# Patient Record
Sex: Male | Born: 1995 | Race: White | Hispanic: No | Marital: Single | Smoking: Current every day smoker
Health system: Southern US, Community
[De-identification: ages and names within clinical notes are randomized; demographics above are authoritative.]

---

## 2017-12-11 ENCOUNTER — Emergency Department (HOSPITAL_COMMUNITY)
Admission: EM | Admit: 2017-12-11 | Discharge: 2017-12-11 | Disposition: A | Discharge status: Home / Self Care | Payer: Self-pay | Attending: Emergency Medicine | Admitting: Emergency Medicine

## 2017-12-11 ENCOUNTER — Encounter (HOSPITAL_COMMUNITY): Payer: Self-pay | Admitting: Emergency Medicine

## 2017-12-11 ENCOUNTER — Emergency Department (HOSPITAL_COMMUNITY): Payer: Self-pay

## 2017-12-11 DIAGNOSIS — F17210 Nicotine dependence, cigarettes, uncomplicated: Secondary | ICD-10-CM | POA: Insufficient documentation

## 2017-12-11 DIAGNOSIS — F1520 Other stimulant dependence, uncomplicated: Secondary | ICD-10-CM | POA: Insufficient documentation

## 2017-12-11 DIAGNOSIS — M79671 Pain in right foot: Secondary | ICD-10-CM | POA: Insufficient documentation

## 2017-12-11 DIAGNOSIS — Z789 Other specified health status: Secondary | ICD-10-CM

## 2017-12-11 MED ORDER — IBUPROFEN 800 MG PO TABS
800.0000 mg | ORAL_TABLET | Freq: Once | ORAL | Status: AC
  Administered 2017-12-11: 800 mg via ORAL
  Filled 2017-12-11: qty 1

## 2017-12-11 NOTE — ED Notes (Signed)
ED Provider at bedside. 

## 2017-12-11 NOTE — ED Notes (Signed)
Bed: [facility] Expected date:  Expected time:  Means of arrival:  Comments: 

## 2017-12-11 NOTE — ED Triage Notes (Signed)
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 Reports last use of yesterday and has been using for about a year. Pt c/o right foot pain since yesterday due to walking 6-7 [place] on it. Denies hallucinations. Reports mild headache. Denies SI/HI thoughts.

## 2017-12-11 NOTE — ED Notes (Signed)
Patient transported to X-ray 

## 2017-12-11 NOTE — Discharge Instructions (Addendum)
Please read and follow all provided instructions.  You have been seen today for request for resources and right foot pain.  I have included a resource guide in your discharge instructions.  Please be sure to call today in order to schedule an appointment with one of these facilities.   Tests performed today include: An x-ray of the affected area - does NOT show any broken bones or dislocations.  Vital signs. See below for your results today.   Home care instructions: -- *PRICE in the first 24-48 hours after injury: Protect (with brace, splint, sling), if given by your provider Rest Ice- Do not apply ice pack directly to your skin, place towel or similar between your skin and ice/ice pack. Apply ice for 20 min, then remove for 40 min while awake Compression  Elevate affected extremity above the level of your heart when not walking around for the first 24-48 hours   Use Ibuprofen (Motrin/Advil) 600mg  every 6 hours as needed for pain (do not exceed max dose in 24 hours, 2400mg )  Follow-up instructions: Please follow-up with your primary care provider or the provided orthopedic physician (bone specialist) if you continue to have significant pain in 1 week. In this case you may have a more severe injury that requires further care.   Return instructions:  Please return if your toes or feet are numb or tingling, appear gray or blue, or you have severe pain (also elevate the leg and loosen splint or wrap if you were given one) Please return to the Emergency Department if you experience worsening symptoms.  Please return if you have any other emergent concerns. Additional Information:  Your vital signs today were: BP (!) 142/94 (BP Location: Left Arm)    Pulse 78    Temp 98.9 F (37.2 C) (Oral)    Resp 18    Ht 5\' 6"  (1.676 m)    SpO2 100%  If your blood pressure (BP) was elevated above 135/85 this visit, please have this repeated by your doctor within one month. ---------------

## 2017-12-11 NOTE — ED Provider Notes (Signed)
[facility]-EMERGENCY DEPT Provider Note   CSN: [id number] Arrival date & time: 12/11/17  1205     History   Chief Complaint Chief Complaint  Patient presents with  . Detox    HPI Peter Pierce is a 21 y.o. male who presents the emergency department with 2 separate complaints today.  Patient primarily is here to get resources due to substance abuse.  Patient states that he uses methamphetamine, most recently used yesterday.  States his use is intermittent, not necessarily daily, difficulty quantifying amount use.  Only other substance use is tobacco, no alcohol or opioids.  States he does not feel as if he is withdrawing right now.  Denies SI/HI or hallucinations. Upon further inquiry, patient reports right foot pain after having walked approximately 6-7 [place] yesterday.  Describes pain as a constant aching pain, worse with ambulation, no alleviating factors.  Denies numbness or weakness.  Denies chest pain, shortness of breath, abdominal pain, or N/V/D. Queried in regards to headache in triage notes, patient denies headache at this time.  HPI  History reviewed. No pertinent past medical history.  There are no active problems to display for this patient.   History reviewed. No pertinent surgical history.     Home Medications    Prior to Admission medications   Not on File    Family History History reviewed. No pertinent family history.  Social History Social History   Tobacco Use  . Smoking status: Current Every Day Smoker  Substance Use Topics  . Alcohol use: No    Frequency: Never  . Drug use: Yes    Types: Methamphetamines     Allergies   Patient has no known allergies.   Review of Systems Review of Systems  Constitutional: Negative for chills and fever.  Eyes: Negative for visual disturbance.  Respiratory: Negative for shortness of breath.   Cardiovascular: Negative for chest pain.  Gastrointestinal: Negative for abdominal pain,  diarrhea, nausea and vomiting.  Musculoskeletal: Positive for myalgias (R foot pain).  Neurological: Negative for weakness, numbness and headaches.  All other systems reviewed and are negative.    Physical Exam Updated Vital Signs BP (!) 142/94 (BP Location: Left Arm)   Pulse 78   Temp 98.9 F (37.2 C) (Oral)   Resp 18   Ht 5\' 6"  (1.676 m)   SpO2 100%   Physical Exam  Constitutional: He appears well-developed and well-nourished. No distress.  HENT:  Head: Normocephalic and atraumatic.  Eyes: Conjunctivae are normal. Pupils are equal, round, and reactive to light. Right eye exhibits no discharge. Left eye exhibits no discharge.  Neck: Neck supple.  Cardiovascular: Normal rate and regular rhythm.  No murmur heard. Pulses:      Dorsalis pedis pulses are 2+ on the right side, and 2+ on the left side.  Pulmonary/Chest: Effort normal and breath sounds normal. No respiratory distress. He has no wheezes.  Abdominal: Soft. He exhibits no distension. There is no tenderness.  Musculoskeletal:  Lower extremities: No obvious deformity, warmth, or erythema.  Right foot- small area of ecchymosis laterally ventrally.  Patient has full range of motion of the ankle.  Tenderness to palpation ventrally over the midfoot.  No other bony tenderness to palpation, no malleolar tenderness, no tenderness at the base of the fifth, and no navicular tenderness.  Neurological:  Alert. Clear speech. No facial droop. CNIII-XII are intact. Bilateral lower extremities' sensation intact to sharp and dull touch. 5/5 plantar and dorsi flexion bilaterally. Normal gait.  Psychiatric: He has a normal mood and affect. His behavior is normal. Thought content normal.  Nursing note and vitals reviewed.    ED Treatments / Results  Labs (all labs ordered are listed, but only abnormal results are displayed) Labs Reviewed - No data to display  EKG  EKG Interpretation None       Radiology Dg Foot Complete  Right  Result Date: 12/11/2017 CLINICAL DATA:  Dorsal foot pain after walking 10 [place] yesterday. EXAM: RIGHT FOOT COMPLETE - 3+ VIEW COMPARISON:  None. FINDINGS: There is no evidence of fracture or dislocation. There is no evidence of arthropathy or other focal bone abnormality. Soft tissues are unremarkable. IMPRESSION: Negative. Electronically Signed   By: Francene BoyersJames  Maxwell M.D.   On: 12/11/2017 13:09    Procedures Procedures (including critical care time)  Medications Ordered in ED Medications  ibuprofen (ADVIL,MOTRIN) tablet 800 mg (not administered)     Initial Impression / Assessment and Plan / ED Course  I have reviewed the triage vital signs and the nursing notes.  Pertinent labs & imaging results that were available during my care of the patient were reviewed by me and considered in my medical decision making (see chart for details).  Patient presents requesting resources to detox from methamphetamine.  Patient without comorbidities to effect detox. Patient denies further drug or alcohol usage. Also denies SI/HI, audio and visual hallucinations. Patient is alert & oriented, nontoxic-appearing, in no apparent distress, with stable vital signs.  Upon further inquiry patient reports right foot pain following walking 6-7 [place] yesterday.  Patient is neurovascularly intact distally.  X-ray is negative for any acute fracture or dislocation.  No recent surgery or open wounds, patient is afebrile, x-ray negative, doubt osteomyelitis.  Ibuprofen given in the emergency department for pain. Patient advised to follow up with orthopedics if symptoms persist for possibility of missed fracture diagnosis. Recommended conservative therapy for foot pain at this time.    Patient is to be discharged home with behavioral health resources for finding a detoxification program. It has been determined that no acute conditions requiring further emergency intervention are present at this time. I discussed results,  treatment plan, need for detox program and orthopedics follow-up, and return precautions with the patient. Provided opportunity for questions, patient confirmed understanding and is in agreement with plan.     Final Clinical Impressions(s) / ED Diagnoses   Final diagnoses:  Right foot pain  Need for community resource    ED Discharge Orders    None       Cherly Andersonetrucelli, Samantha R, [place]PA-C 12/11/17 1340    Doug SouJacubowitz, Sam, MD 12/11/17 [id number]

## 2018-12-08 IMAGING — CR DG FOOT COMPLETE 3+V*R*
3 series · 3 of 3 positions shown · non-contrast
Comparison: None.

CLINICAL DATA: Dorsal foot pain after walking 10 miles yesterday.

EXAM:
RIGHT FOOT COMPLETE - 3+ VIEW

[x foot ap right]
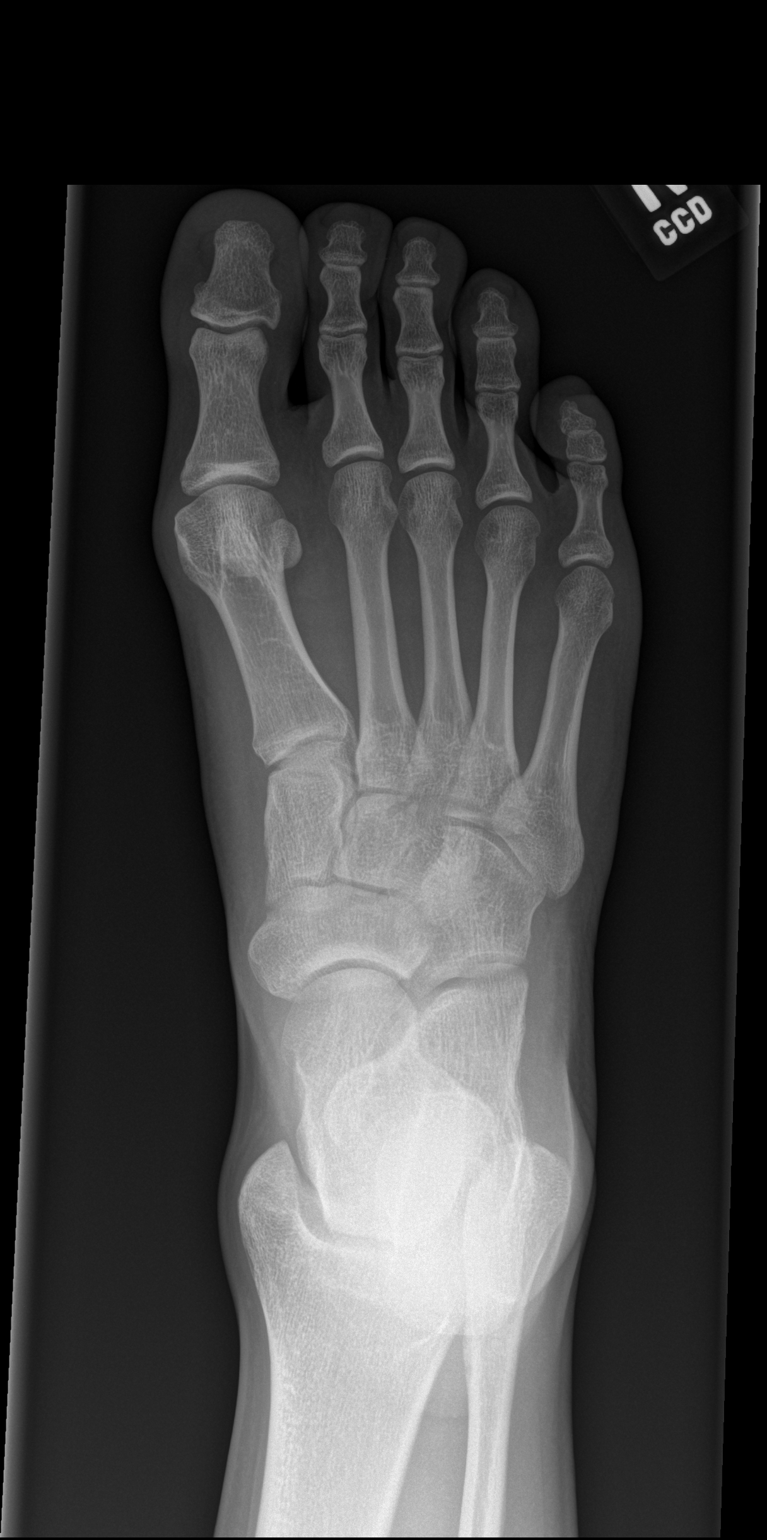

[x foot obl right]
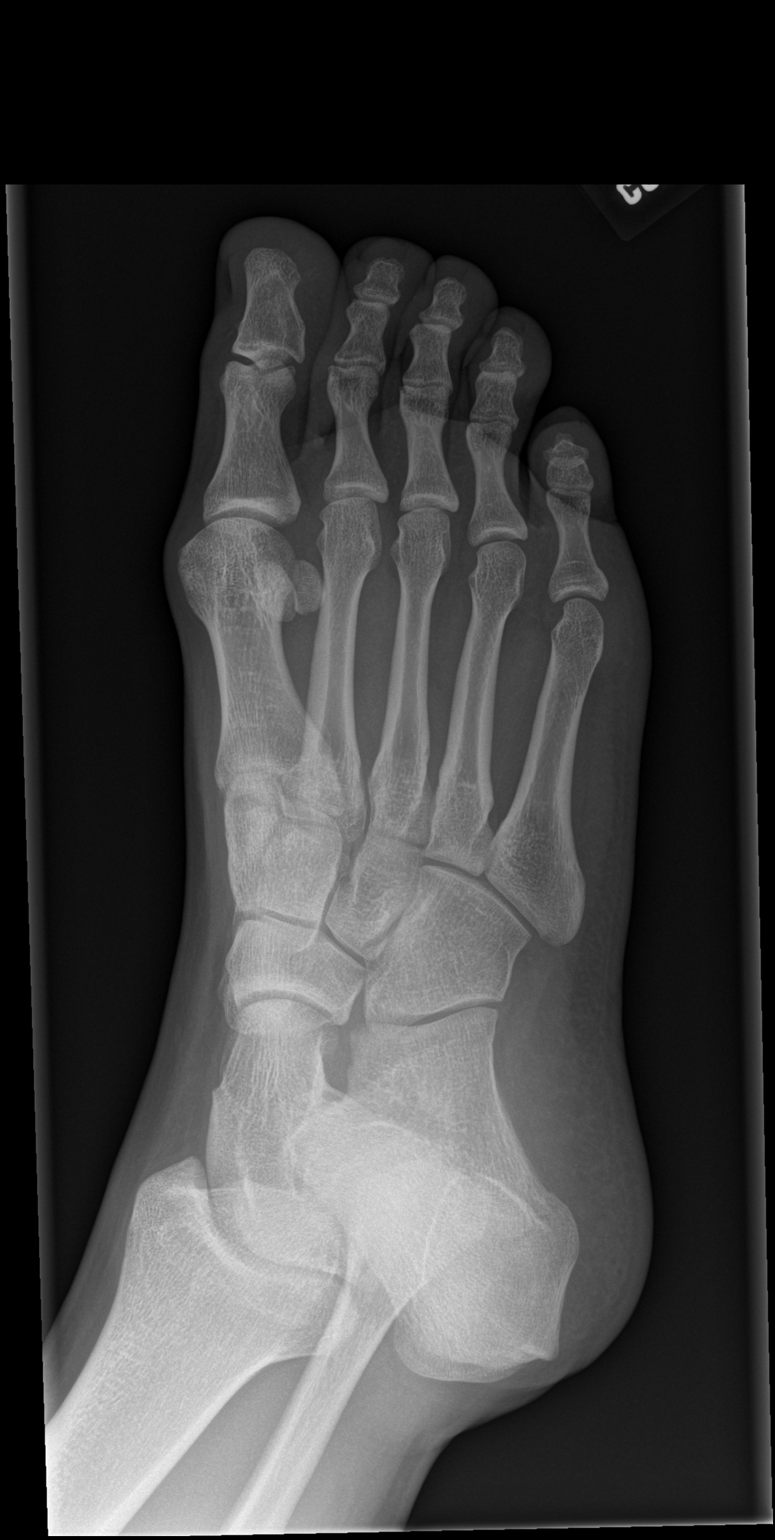

[x foot lat right]
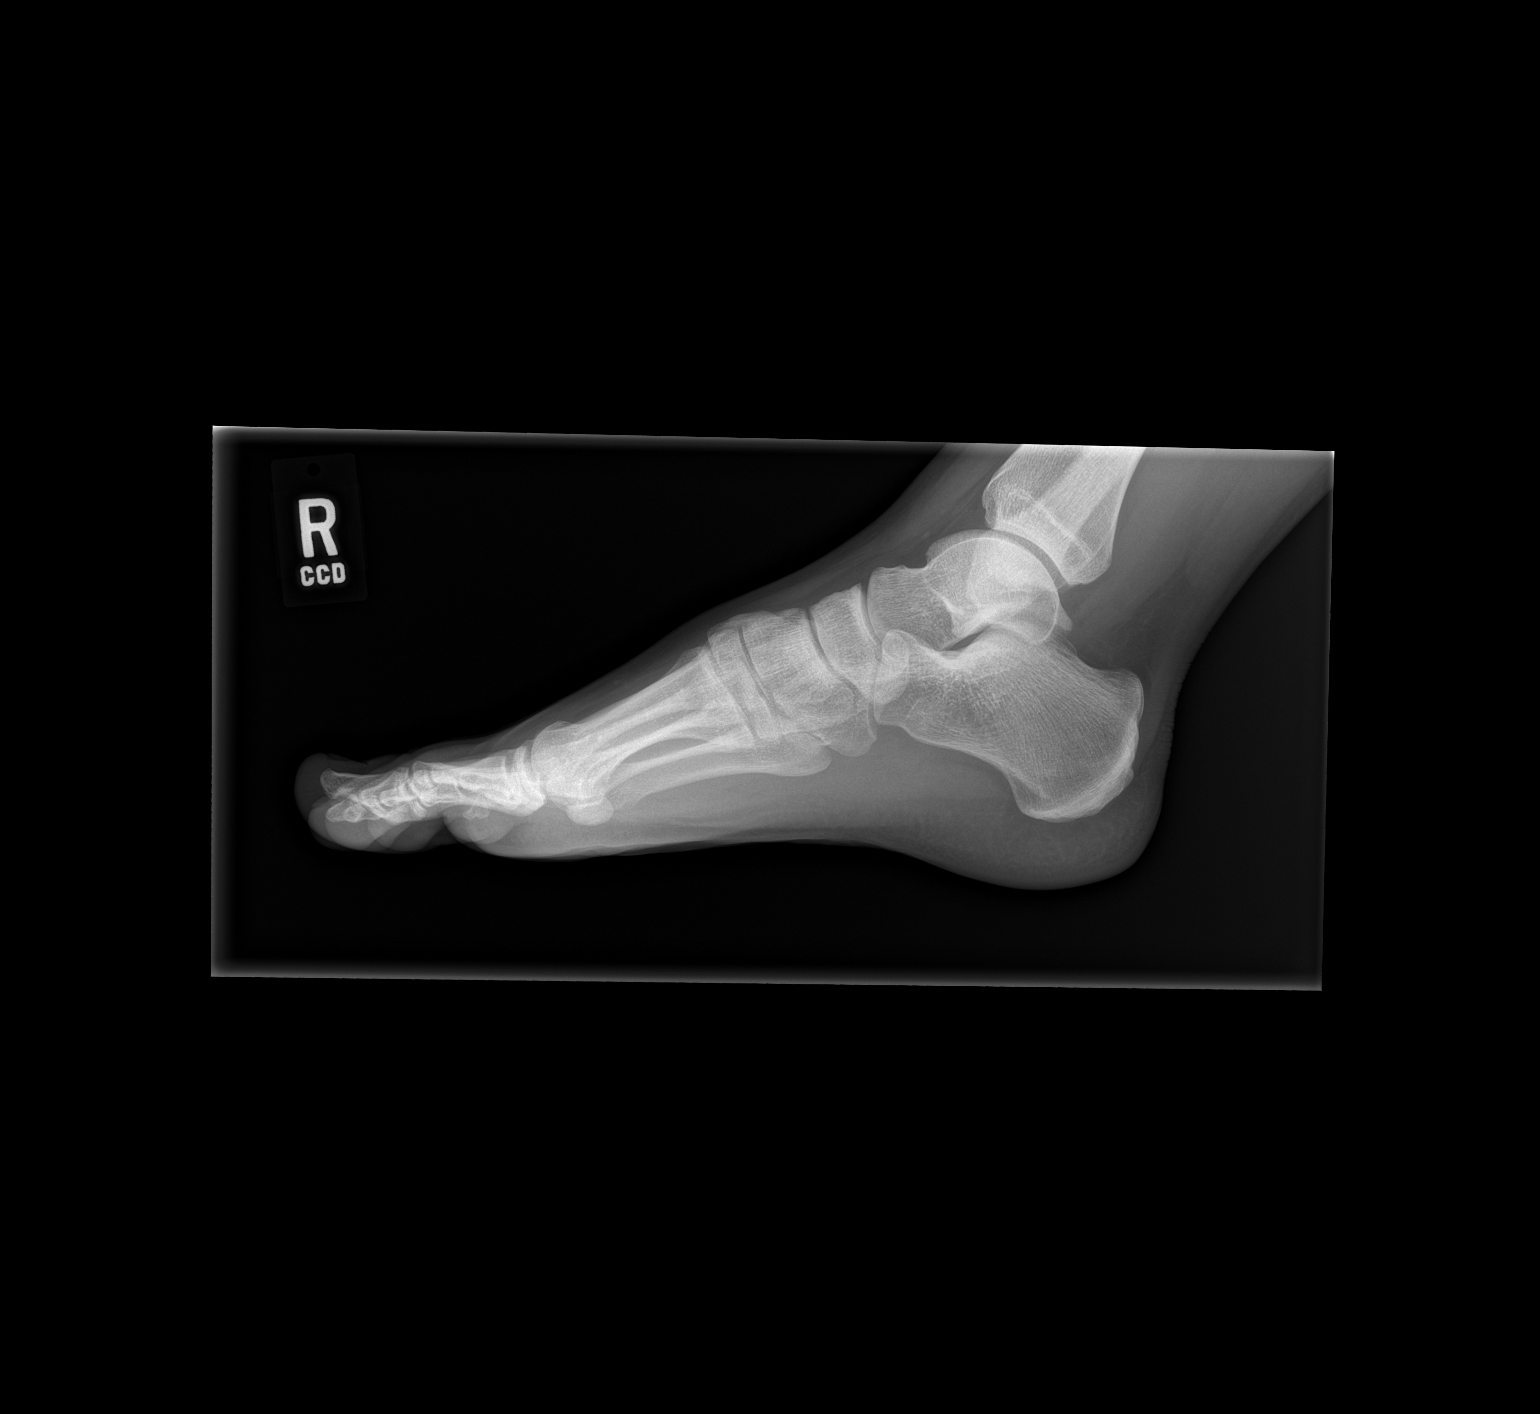

[3 of 3 positions shown; findings below may reference images not displayed]

FINDINGS: There is no evidence of fracture or dislocation. There is no
evidence of arthropathy or other focal bone abnormality. Soft
tissues are unremarkable.
IMPRESSION: Negative.
